# Patient Record
Sex: Male | Born: 1990 | Race: Black or African American | Hispanic: No | Marital: Single | Smoking: Never smoker
Health system: Southern US, Community
[De-identification: ages and names within clinical notes are randomized; demographics above are authoritative.]

## PROBLEM LIST (undated history)

## (undated) DIAGNOSIS — D573 Sickle-cell trait: Secondary | ICD-10-CM

## (undated) HISTORY — PX: TONSILLECTOMY: SUR1361

## (undated) HISTORY — DX: Sickle-cell trait: D57.3

---
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
## 2010-09-20 ENCOUNTER — Inpatient Hospital Stay (INDEPENDENT_AMBULATORY_CARE_PROVIDER_SITE_OTHER)
Admission: RE | Admit: 2010-09-20 | Discharge: 2010-09-20 | Disposition: A | Discharge status: Home / Self Care | Payer: Self-pay | Source: Ambulatory Visit | Attending: Family Medicine | Admitting: Family Medicine

## 2010-09-20 DIAGNOSIS — M62838 Other muscle spasm: Secondary | ICD-10-CM

## 2010-09-20 DIAGNOSIS — T148XXA Other injury of unspecified body region, initial encounter: Secondary | ICD-10-CM

## 2010-09-20 DIAGNOSIS — M549 Dorsalgia, unspecified: Secondary | ICD-10-CM

## 2011-04-03 LAB — INFLUENZA A+B VIRUS AG-DIRECT(RAPID)
Inflenza A Ag: POSITIVE — AB
Influenza B Ag: NEGATIVE

## 2016-09-05 ENCOUNTER — Emergency Department (HOSPITAL_COMMUNITY): Payer: Managed Care, Other (non HMO)

## 2016-09-05 ENCOUNTER — Encounter (HOSPITAL_COMMUNITY): Payer: Self-pay | Admitting: *Deleted

## 2016-09-05 ENCOUNTER — Emergency Department (HOSPITAL_COMMUNITY)
Admission: EM | Admit: 2016-09-05 | Discharge: 2016-09-06 | Disposition: A | Discharge status: Home / Self Care | Payer: Managed Care, Other (non HMO) | Attending: Emergency Medicine | Admitting: Emergency Medicine

## 2016-09-05 DIAGNOSIS — Y999 Unspecified external cause status: Secondary | ICD-10-CM | POA: Insufficient documentation

## 2016-09-05 DIAGNOSIS — W34018A Accidental discharge of other gas, air or spring-operated gun, initial encounter: Secondary | ICD-10-CM | POA: Insufficient documentation

## 2016-09-05 DIAGNOSIS — S91301A Unspecified open wound, right foot, initial encounter: Secondary | ICD-10-CM | POA: Diagnosis present

## 2016-09-05 DIAGNOSIS — Y939 Activity, unspecified: Secondary | ICD-10-CM | POA: Insufficient documentation

## 2016-09-05 DIAGNOSIS — S91331A Puncture wound without foreign body, right foot, initial encounter: Secondary | ICD-10-CM

## 2016-09-05 DIAGNOSIS — F17200 Nicotine dependence, unspecified, uncomplicated: Secondary | ICD-10-CM | POA: Diagnosis not present

## 2016-09-05 DIAGNOSIS — Y929 Unspecified place or not applicable: Secondary | ICD-10-CM | POA: Insufficient documentation

## 2016-09-05 DIAGNOSIS — W3400XA Accidental discharge from unspecified firearms or gun, initial encounter: Secondary | ICD-10-CM

## 2016-09-05 MED ORDER — HYDROMORPHONE HCL 2 MG/ML IJ SOLN
0.5000 mg | Freq: Once | INTRAMUSCULAR | Status: AC
  Administered 2016-09-05: 0.5 mg via INTRAVENOUS
  Filled 2016-09-05: qty 1

## 2016-09-05 MED ORDER — OXYCODONE-ACETAMINOPHEN 5-325 MG PO TABS
1.0000 | ORAL_TABLET | Freq: Once | ORAL | Status: AC
  Administered 2016-09-05: 1 via ORAL
  Filled 2016-09-05: qty 1

## 2016-09-05 MED ORDER — TETANUS-DIPHTH-ACELL PERTUSSIS 5-2.5-18.5 LF-MCG/0.5 IM SUSP
0.5000 mL | Freq: Once | INTRAMUSCULAR | Status: AC
  Administered 2016-09-05: 0.5 mL via INTRAMUSCULAR
  Filled 2016-09-05: qty 0.5

## 2016-09-05 MED ORDER — CEFAZOLIN SODIUM-DEXTROSE 2-4 GM/100ML-% IV SOLN
2.0000 g | Freq: Once | INTRAVENOUS | Status: AC
  Administered 2016-09-05: 2 g via INTRAVENOUS
  Filled 2016-09-05: qty 100

## 2016-09-05 MED ORDER — CEPHALEXIN 500 MG PO CAPS: 500.0000 mg | ORAL_CAPSULE | Freq: Four times a day (QID) | ORAL | 0 refills | Status: DC

## 2016-09-05 MED ORDER — LIDOCAINE HCL (PF) 1 % IJ SOLN
5.0000 mL | Freq: Once | INTRAMUSCULAR | Status: AC
  Administered 2016-09-05: 5 mL
  Filled 2016-09-05: qty 5

## 2016-09-05 MED ORDER — OXYCODONE-ACETAMINOPHEN 5-325 MG PO TABS: 1.0000 | ORAL_TABLET | Freq: Four times a day (QID) | ORAL | 0 refills | Status: DC | PRN
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
## 2016-09-05 NOTE — Discharge Instructions (Signed)
Watch for signs of infection. Follow-up with either orthopedic surgery or podiatry in about a week. You have one stitch in your foot but will need to get removed. Watch for redness or increased swelling of the area. Watch for fevers.

## 2016-09-06 NOTE — Progress Notes (Signed)
Orthopedic Tech Progress Note Patient Details:  Evan Picketerry C Weisgerber Jr. 04/10/1991 [id number]  Ortho Devices Type of Ortho Device: Postop shoe/boot Ortho Device/Splint Location: rle Ortho Device/Splint Interventions: Ordered, Application   Trinna PostMartinez, Micahel Omlor J 09/06/2016, 6:13 AM

## 2016-09-09 ENCOUNTER — Encounter: Payer: Self-pay | Admitting: Podiatry

## 2016-09-09 ENCOUNTER — Ambulatory Visit (INDEPENDENT_AMBULATORY_CARE_PROVIDER_SITE_OTHER): Payer: Managed Care, Other (non HMO) | Admitting: Podiatry

## 2016-09-09 VITALS — BP 110/66 | HR 77 | Temp 99.2°F

## 2016-09-09 DIAGNOSIS — S91301A Unspecified open wound, right foot, initial encounter: Secondary | ICD-10-CM

## 2016-09-09 DIAGNOSIS — S91331A Puncture wound without foreign body, right foot, initial encounter: Principal | ICD-10-CM

## 2016-09-09 DIAGNOSIS — W3400XA Accidental discharge from unspecified firearms or gun, initial encounter: Secondary | ICD-10-CM

## 2016-09-09 MED ORDER — OXYCODONE-ACETAMINOPHEN 5-325 MG PO TABS: 1.0000 | ORAL_TABLET | Freq: Three times a day (TID) | ORAL | 0 refills | Status: DC | PRN

## 2016-09-09 MED ORDER — MUPIROCIN CALCIUM 2 % EX CREA: 1.0000 "application " | TOPICAL_CREAM | Freq: Two times a day (BID) | CUTANEOUS | 0 refills | Status: DC
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
## 2016-09-21 ENCOUNTER — Ambulatory Visit: Payer: Managed Care, Other (non HMO) | Admitting: Podiatry

## 2016-09-23 ENCOUNTER — Encounter: Payer: Self-pay | Admitting: Podiatry

## 2016-09-23 ENCOUNTER — Ambulatory Visit (INDEPENDENT_AMBULATORY_CARE_PROVIDER_SITE_OTHER): Payer: Managed Care, Other (non HMO) | Admitting: Podiatry

## 2016-09-23 DIAGNOSIS — S91301D Unspecified open wound, right foot, subsequent encounter: Secondary | ICD-10-CM | POA: Diagnosis not present

## 2016-09-23 DIAGNOSIS — L97412 Non-pressure chronic ulcer of right heel and midfoot with fat layer exposed: Secondary | ICD-10-CM

## 2016-09-23 DIAGNOSIS — S91331D Puncture wound without foreign body, right foot, subsequent encounter: Principal | ICD-10-CM

## 2016-09-23 DIAGNOSIS — W3400XD Accidental discharge from unspecified firearms or gun, subsequent encounter: Secondary | ICD-10-CM

## 2016-09-28 LAB — WOUND CULTURE
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
## 2016-10-07 ENCOUNTER — Encounter: Payer: Self-pay | Admitting: Podiatry

## 2016-10-07 ENCOUNTER — Ambulatory Visit (INDEPENDENT_AMBULATORY_CARE_PROVIDER_SITE_OTHER): Payer: Managed Care, Other (non HMO) | Admitting: Podiatry

## 2016-10-07 DIAGNOSIS — S91301D Unspecified open wound, right foot, subsequent encounter: Secondary | ICD-10-CM | POA: Diagnosis not present

## 2016-10-07 DIAGNOSIS — W3400XD Accidental discharge from unspecified firearms or gun, subsequent encounter: Secondary | ICD-10-CM | POA: Diagnosis not present

## 2016-10-07 DIAGNOSIS — S91331D Puncture wound without foreign body, right foot, subsequent encounter: Principal | ICD-10-CM
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
## 2016-10-19 ENCOUNTER — Encounter: Payer: Self-pay | Admitting: Podiatry

## 2017-06-01 ENCOUNTER — Ambulatory Visit (INDEPENDENT_AMBULATORY_CARE_PROVIDER_SITE_OTHER): Payer: Managed Care, Other (non HMO) | Admitting: Family Medicine

## 2017-06-01 ENCOUNTER — Encounter: Payer: Self-pay | Admitting: Family Medicine

## 2017-06-01 VITALS — BP 132/80 | HR 80 | Temp 98.1°F | Ht 66.5 in | Wt 147.8 lb

## 2017-06-01 DIAGNOSIS — M545 Low back pain, unspecified: Secondary | ICD-10-CM
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
## 2017-06-01 NOTE — Patient Instructions (Addendum)
Use heat and 2 Aleve twice daily with food. If you decide to get an XR of your low back let me know.

## 2017-06-02 ENCOUNTER — Telehealth: Payer: Self-pay | Admitting: Family Medicine

## 2017-06-02 NOTE — Telephone Encounter (Signed)
Called patient and explained we will not be able to complete his form as he has some back issues that we cannot sign off on.  Patient continued to talk non stop and tell me what Vickie needed to do, on and on.  So I finally inturrupted him and advised we are not doing anything further.  For him to take this day and get another set of forms from his employer and take to whomever his employer recommends.  Patient dismissed.

## 2017-06-02 NOTE — Telephone Encounter (Signed)
Please call let him know that I cannot fill out this form as we discussed yesterday.  We do not have a diagnosis for his low back pain at this point.

## 2017-06-02 NOTE — Telephone Encounter (Signed)
Maria with [employer] called to speak with you.  She wanted to know why you did not complete the FITNESS FOR DUTY FORM.  How will she know what his restrictions are?  This form is given to every employee.  She states this is not workers comp.  [phone]

## 2017-06-02 NOTE — Telephone Encounter (Signed)
Pt dropped off form to be filled out, states it just needs to state no restrictions on there he needs this filled out before he can go back to work, he can be reached at [phone] when ready to be picked up

## 2017-06-02 NOTE — Telephone Encounter (Signed)
The patient is aware and can discuss this with his employer.

## 2017-09-28 ENCOUNTER — Ambulatory Visit (INDEPENDENT_AMBULATORY_CARE_PROVIDER_SITE_OTHER): Payer: Managed Care, Other (non HMO) | Admitting: Family Medicine

## 2017-09-28 ENCOUNTER — Other Ambulatory Visit: Payer: Self-pay

## 2017-09-28 ENCOUNTER — Encounter: Payer: Self-pay | Admitting: Family Medicine

## 2017-09-28 VITALS — BP 110/60 | HR 69 | Temp 98.0°F | Ht 66.0 in | Wt 146.0 lb

## 2017-09-28 DIAGNOSIS — R300 Dysuria: Secondary | ICD-10-CM | POA: Diagnosis not present

## 2017-09-28 DIAGNOSIS — Z113 Encounter for screening for infections with a predominantly sexual mode of transmission: Secondary | ICD-10-CM | POA: Diagnosis not present

## 2017-09-28 DIAGNOSIS — D573 Sickle-cell trait: Secondary | ICD-10-CM

## 2017-09-28 DIAGNOSIS — Z0000 Encounter for general adult medical examination without abnormal findings: Secondary | ICD-10-CM

## 2017-09-28 DIAGNOSIS — Z0001 Encounter for general adult medical examination with abnormal findings: Secondary | ICD-10-CM | POA: Diagnosis not present

## 2017-09-28 LAB — POCT URINALYSIS DIP (MANUAL ENTRY)
Bilirubin, UA: NEGATIVE
Glucose, UA: NEGATIVE mg/dL
Ketones, POC UA: NEGATIVE mg/dL
Leukocytes, UA: NEGATIVE
Nitrite, UA: NEGATIVE
PROTEIN UA: NEGATIVE mg/dL
RBC UA: NEGATIVE
SPEC GRAV UA: 1.02 (ref 1.010–1.025)
UROBILINOGEN UA: 0.2 U/dL
pH, UA: 7 (ref 5.0–8.0)

## 2017-09-28 LAB — POCT GLYCOSYLATED HEMOGLOBIN (HGB A1C): HEMOGLOBIN A1C: 5.6

## 2017-09-28 NOTE — Patient Instructions (Addendum)
It was great meeting you today! I am glad that things have been going well. In regards to your burning with urination, that good news is that your urine sample came back with no problems. Unfortunately we will have to have you back another day for the std screening as this test needs to be done 1 hour after your urine. Unfortunately the lab will be closed at this point. I will draw some blood work today. The labs I will get will be a cbc, cmp, lipid panel, a1c, and an hiv screen. In order these labs will test your blood levels, kidney/liver function, cholesterol, if you have diabetes, and for hiv.

## 2017-09-29 ENCOUNTER — Encounter: Payer: Self-pay | Admitting: Family Medicine

## 2017-09-29 DIAGNOSIS — Z0000 Encounter for general adult medical examination without abnormal findings: Secondary | ICD-10-CM | POA: Insufficient documentation

## 2017-09-29 DIAGNOSIS — Z113 Encounter for screening for infections with a predominantly sexual mode of transmission: Secondary | ICD-10-CM | POA: Insufficient documentation

## 2017-09-29 DIAGNOSIS — D573 Sickle-cell trait: Secondary | ICD-10-CM | POA: Insufficient documentation

## 2017-09-29 DIAGNOSIS — R300 Dysuria: Secondary | ICD-10-CM | POA: Insufficient documentation

## 2017-09-29 LAB — CBC WITH DIFFERENTIAL/PLATELET
BASOS: 0 %
Basophils Absolute: 0 10*3/uL (ref 0.0–0.2)
EOS (ABSOLUTE): 0.2 10*3/uL (ref 0.0–0.4)
EOS: 3 %
HEMATOCRIT: 42.1 % (ref 37.5–51.0)
HEMOGLOBIN: 15.4 g/dL (ref 13.0–17.7)
IMMATURE GRANS (ABS): 0 10*3/uL (ref 0.0–0.1)
IMMATURE GRANULOCYTES: 0 %
LYMPHS: 41 %
Lymphocytes Absolute: 2.2 10*3/uL (ref 0.7–3.1)
MCH: 30.3 pg (ref 26.6–33.0)
MCHC: 36.6 g/dL — ABNORMAL HIGH (ref 31.5–35.7)
MCV: 83 fL (ref 79–97)
MONOCYTES: 11 %
Monocytes Absolute: 0.6 10*3/uL (ref 0.1–0.9)
NEUTROS ABS: 2.4 10*3/uL (ref 1.4–7.0)
NEUTROS PCT: 45 %
Platelets: 266 10*3/uL (ref 150–379)
RBC: 5.09 x10E6/uL (ref 4.14–5.80)
RDW: 13.6 % (ref 12.3–15.4)
WBC: 5.5 10*3/uL (ref 3.4–10.8)

## 2017-09-29 LAB — COMPREHENSIVE METABOLIC PANEL
ALBUMIN: 4.8 g/dL (ref 3.5–5.5)
ALT: 12 IU/L (ref 0–44)
AST: 11 IU/L (ref 0–40)
Albumin/Globulin Ratio: 2.2 (ref 1.2–2.2)
Alkaline Phosphatase: 62 IU/L (ref 39–117)
BUN/Creatinine Ratio: 14 (ref 9–20)
BUN: 11 mg/dL (ref 6–20)
Bilirubin Total: 0.2 mg/dL (ref 0.0–1.2)
CALCIUM: 9.3 mg/dL (ref 8.7–10.2)
CO2: 23 mmol/L (ref 20–29)
CREATININE: 0.79 mg/dL (ref 0.76–1.27)
Chloride: 101 mmol/L (ref 96–106)
GFR, EST AFRICAN AMERICAN: 140 mL/min/{1.73_m2} (ref >59)
GFR, EST NON AFRICAN AMERICAN: 121 mL/min/{1.73_m2} (ref >59)
GLOBULIN, TOTAL: 2.2 g/dL (ref 1.5–4.5)
Glucose: 84 mg/dL (ref 65–99)
Potassium: 4.1 mmol/L (ref 3.5–5.2)
SODIUM: 138 mmol/L (ref 134–144)
TOTAL PROTEIN: 7 g/dL (ref 6.0–8.5)

## 2017-09-29 LAB — LIPID PANEL
CHOL/HDL RATIO: 2.1 ratio (ref 0.0–5.0)
Cholesterol, Total: 185 mg/dL (ref 100–199)
HDL: 90 mg/dL (ref >39)
LDL CALC: 88 mg/dL (ref 0–99)
TRIGLYCERIDES: 36 mg/dL (ref 0–149)
VLDL Cholesterol Cal: 7 mg/dL (ref 5–40)

## 2017-09-29 LAB — HIV ANTIBODY (ROUTINE TESTING W REFLEX): HIV SCREEN 4TH GENERATION: NONREACTIVE

## 2017-09-29 NOTE — Assessment & Plan Note (Signed)
UA negative. Unclear what is causing his occasional burning with urination. Will ask patient to come back for std screen as could not collect second urine sample before lab closed. Most likely explanation is dehydration.
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
## 2017-09-29 NOTE — Assessment & Plan Note (Signed)
Educated patient on sickle cell trait. Told him to be careful about strenuous activity at high altitudes as this is his main risk factor.

## 2017-09-29 NOTE — Assessment & Plan Note (Signed)
HIV negative. CBC, CMP, lipid panel, ua, all negative for abnormality. Up to date on all health maintenance items.

## 2017-10-01 ENCOUNTER — Telehealth: Payer: Self-pay | Admitting: Family Medicine

## 2017-10-01 NOTE — Telephone Encounter (Signed)
Called patient to inform him that cbc, cmp, hiv, a1c were all within normal limits or negative. Patient glad to hear news and glad that I called him.  Myrene BuddyJacob Hattie Pine MD PGY-1 Family Medicine Resident

## 2019-02-16 ENCOUNTER — Other Ambulatory Visit: Payer: Self-pay

## 2019-02-16 ENCOUNTER — Emergency Department (HOSPITAL_COMMUNITY)
Admission: EM | Admit: 2019-02-16 | Discharge: 2019-02-17 | Disposition: A | Discharge status: Home / Self Care | Payer: Managed Care, Other (non HMO)

## 2019-02-17 NOTE — ED Notes (Signed)
Pt called for triage, no response from the lobby. 

## 2020-04-02 ENCOUNTER — Emergency Department (HOSPITAL_COMMUNITY)
Admission: EM | Admit: 2020-04-02 | Discharge: 2020-04-02 | Disposition: A | Discharge status: Home / Self Care | Payer: Self-pay | Attending: Emergency Medicine | Admitting: Emergency Medicine

## 2020-04-02 ENCOUNTER — Emergency Department (HOSPITAL_COMMUNITY): Payer: Self-pay

## 2020-04-02 ENCOUNTER — Encounter (HOSPITAL_COMMUNITY): Payer: Self-pay | Admitting: *Deleted

## 2020-04-02 DIAGNOSIS — Z5321 Procedure and treatment not carried out due to patient leaving prior to being seen by health care provider: Secondary | ICD-10-CM | POA: Insufficient documentation

## 2020-04-02 DIAGNOSIS — W230XXA Caught, crushed, jammed, or pinched between moving objects, initial encounter: Secondary | ICD-10-CM | POA: Insufficient documentation

## 2020-04-02 DIAGNOSIS — S6991XA Unspecified injury of right wrist, hand and finger(s), initial encounter: Secondary | ICD-10-CM | POA: Insufficient documentation

## 2020-04-02 DIAGNOSIS — Y92810 Car as the place of occurrence of the external cause: Secondary | ICD-10-CM | POA: Insufficient documentation

## 2020-04-02 NOTE — ED Notes (Signed)
No answer x3 for ready room 
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
## 2020-04-03 ENCOUNTER — Ambulatory Visit
Admission: EM | Admit: 2020-04-03 | Discharge: 2020-04-03 | Disposition: A | Discharge status: Home / Self Care | Payer: Self-pay | Attending: Family Medicine | Admitting: Family Medicine

## 2020-04-03 ENCOUNTER — Ambulatory Visit
Admission: EM | Admit: 2020-04-03 | Discharge: 2020-04-03 | Disposition: A | Discharge status: Other Acute Inpatient Hospital | Payer: Self-pay

## 2020-04-03 ENCOUNTER — Other Ambulatory Visit: Payer: Self-pay

## 2020-04-03 DIAGNOSIS — M20011 Mallet finger of right finger(s): Secondary | ICD-10-CM
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
## 2020-04-03 NOTE — ED Provider Notes (Signed)
[facility]    CSN: [id number] Arrival date & time: 04/03/20  1633      History   Chief Complaint Chief Complaint  Patient presents with  . Finger Injury    HPI Evan Espin. is a 29 y.o. male.   HPI  Patient woke up Monday with his right middle finger and noticed his finger was curved down. Reports pain with bending his hand and making a fist. On Sunday was renovating his mom's house. Denies injuring finger. Took some ibuprofen with some relief. He brought a finger splin which has helped his pain. States his boss at [employer] told him he needed to be seen as he was wearing a splint on his finger.    Past Medical History:  Diagnosis Date  . Sickle cell trait [employer])     Patient Active Problem List   Diagnosis Date Noted  . Healthcare maintenance 09/29/2017  . Dysuria 09/29/2017  . Screening for STD (sexually transmitted disease) 09/29/2017  . Sickle cell trait (HCC) 09/29/2017    Past Surgical History:  Procedure Laterality Date  . TONSILLECTOMY         Home Medications    Prior to Admission medications   Not on File    Family History History reviewed. No pertinent family history.  Social History Social History   Tobacco Use  . Smoking status: Never Smoker  . Smokeless tobacco: Current User  Substance Use Topics  . Alcohol use: No  . Drug use: Yes    Types: Marijuana     Allergies   Patient has no known allergies.   Review of Systems Review of Systems: See HPI    Physical Exam Triage Vital Signs ED Triage Vitals  Enc Vitals Group     BP 04/03/20 1650 124/86     Pulse Rate 04/03/20 1650 71     Resp 04/03/20 1650 18     Temp 04/03/20 1650 98.1 F (36.7 C)     Temp Source 04/03/20 1650 Oral     SpO2 04/03/20 1650 100 %     Weight 04/03/20 1652 155 lb (70.3 kg)     Height 04/03/20 1652 5\' 7"  (1.702 m)     Head Circumference --      Peak Flow --      Pain Score 04/03/20 1651 0     Pain Loc --      Pain Edu? --       Excl. in GC? --    No data found.  Updated Vital Signs BP 124/86   Pulse 71   Temp 98.1 F (36.7 C) (Oral)   Resp 18   Ht 5\' 7"  (1.702 m)   Wt 155 lb (70.3 kg)   SpO2 100%   BMI 24.28 kg/m   Visual Acuity Right Eye Distance:   Left Eye Distance:   Bilateral Distance:    Right Eye Near:   Left Eye Near:    Bilateral Near:     Physical Exam Vitals and nursing note reviewed.  Constitutional:      General: He is not in acute distress.    Appearance: Normal appearance.  HENT:     Head: Normocephalic and atraumatic.     Nose: Nose normal.  Eyes:     Extraocular Movements: Extraocular movements intact.     Conjunctiva/sclera: Conjunctivae normal.  Cardiovascular:     Rate and Rhythm: Normal rate.     Pulses: Normal pulses.  Pulmonary:  Effort: Pulmonary effort is normal. No respiratory distress.  Musculoskeletal:        General: Swelling (mild) and tenderness (right middle finger) present.     Cervical back: Normal range of motion.     Comments: Right DIP of middle finger stuck in flexion, no extension c/w mallet finger   Skin:    General: Skin is warm.     Capillary Refill: Capillary refill takes less than 2 seconds.  Neurological:     Mental Status: He is alert and oriented to person, place, and time.      UC Treatments / Results  Labs (all labs ordered are listed, but only abnormal results are displayed) Labs Reviewed - No data to display  EKG   Radiology DG Finger Middle Right  Result Date: 04/02/2020 CLINICAL DATA:  Pain EXAM: RIGHT MIDDLE FINGER 2+V COMPARISON:  None. FINDINGS: There is no evidence of fracture or dislocation. There is no evidence of arthropathy or other focal bone abnormality. Soft tissues are unremarkable. IMPRESSION: Negative. Electronically Signed   By: Katherine Mantle M.D.   On: 04/02/2020 20:45    Procedures Procedures (including critical care time)  Medications Ordered in UC Medications - No data to display  Initial  Impression / Assessment and Plan / UC Course  I have reviewed the triage vital signs and the nursing notes.  Pertinent labs & imaging results that were available during my care of the patient were reviewed by me and considered in my medical decision making (see chart for details).     Mallet Finger  Reviewed xrays from ED visit yesterday though pt left without being seen. Patient denies trauma. Xrays normal. Exam consistent with mallet finger. Patient to follow up with [facility] for hand surgery. Stacker splint applied. Advised patient to wear splint at all times. OTC medication for pain as needed. Work note provided. Patient agrees with plan.    Final Clinical Impressions(s) / UC Diagnoses   Final diagnoses:  Mallet finger of right hand     Discharge Instructions     You were seen at the Urgent Care for finger injury. Please pick up your prescriptions at your pharmacy. Be sure to wear splint all day to prevent re-injury for the next 8 weeks. Follow up with hand surgery at [facility].  If you haven't already, sign up for My Chart to have easy access to your labs results, and communication with your primary care physician.  Dr. Rachael Darby      ED Prescriptions    None     PDMP not reviewed this encounter.   Katha Cabal, DO 04/03/20 1823

## 2021-09-14 IMAGING — DX DG FINGER MIDDLE 2+V*R*
3 series · 3 of 3 positions shown · non-contrast
Comparison: None.

CLINICAL DATA: Pain

EXAM:
RIGHT MIDDLE FINGER 2+V

[finger ap]
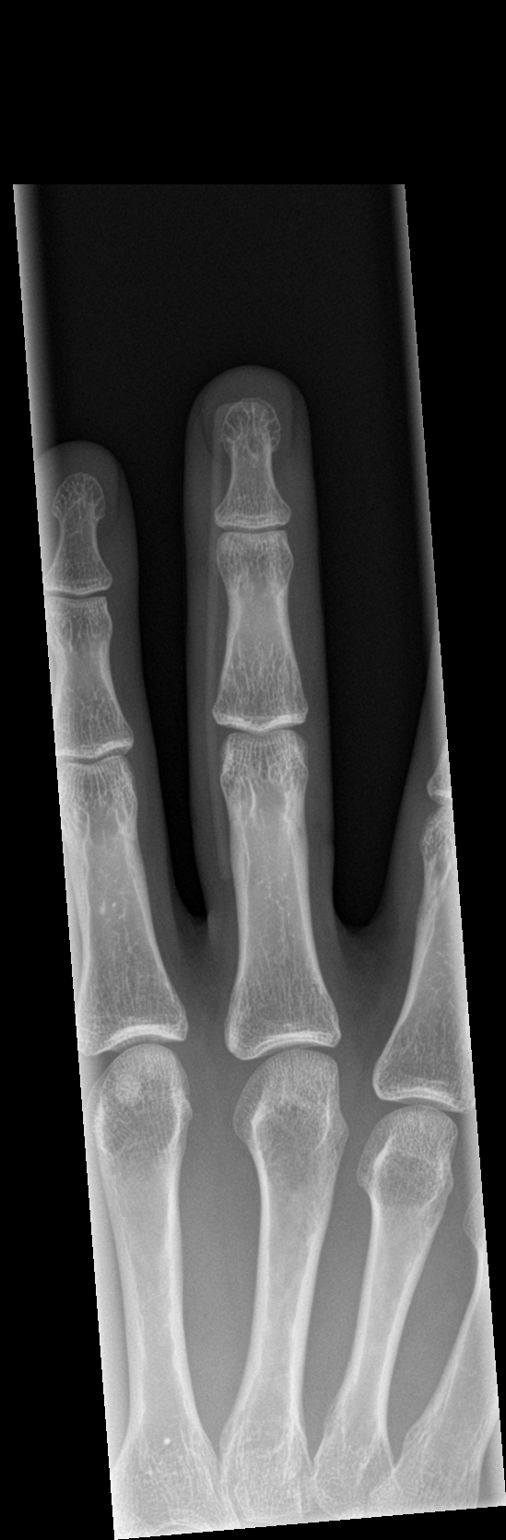

[finger obl]
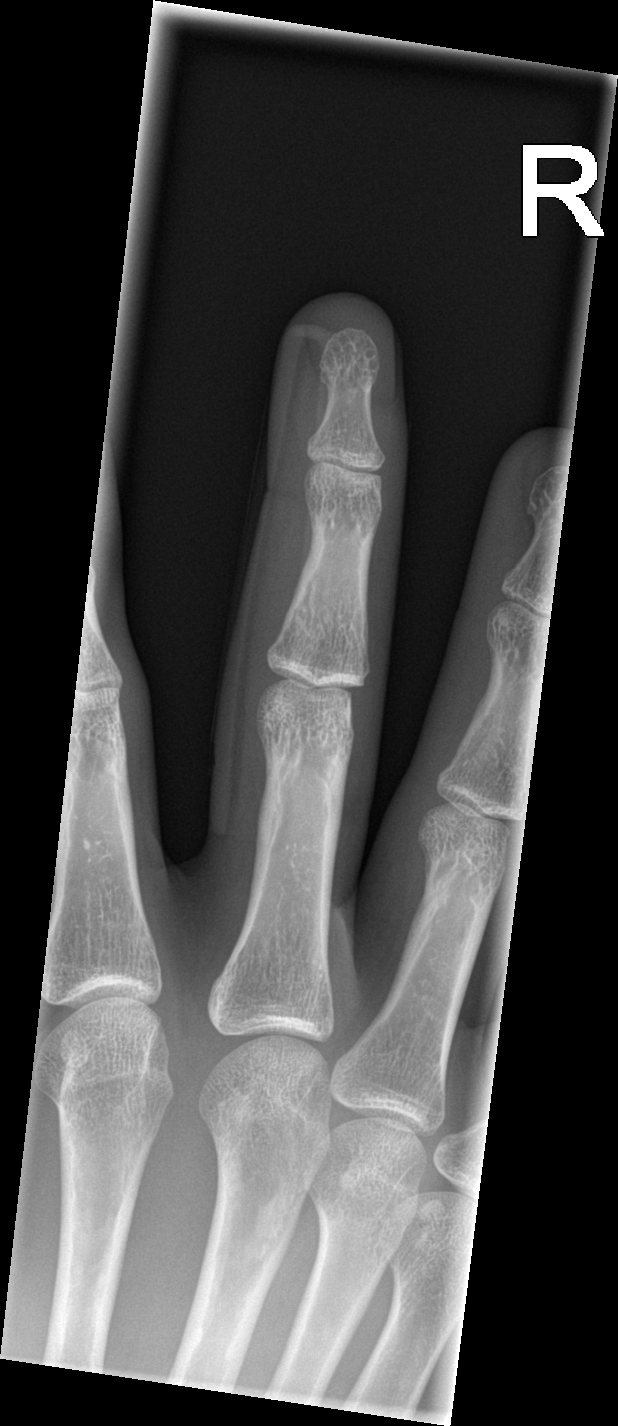

[finger lat]
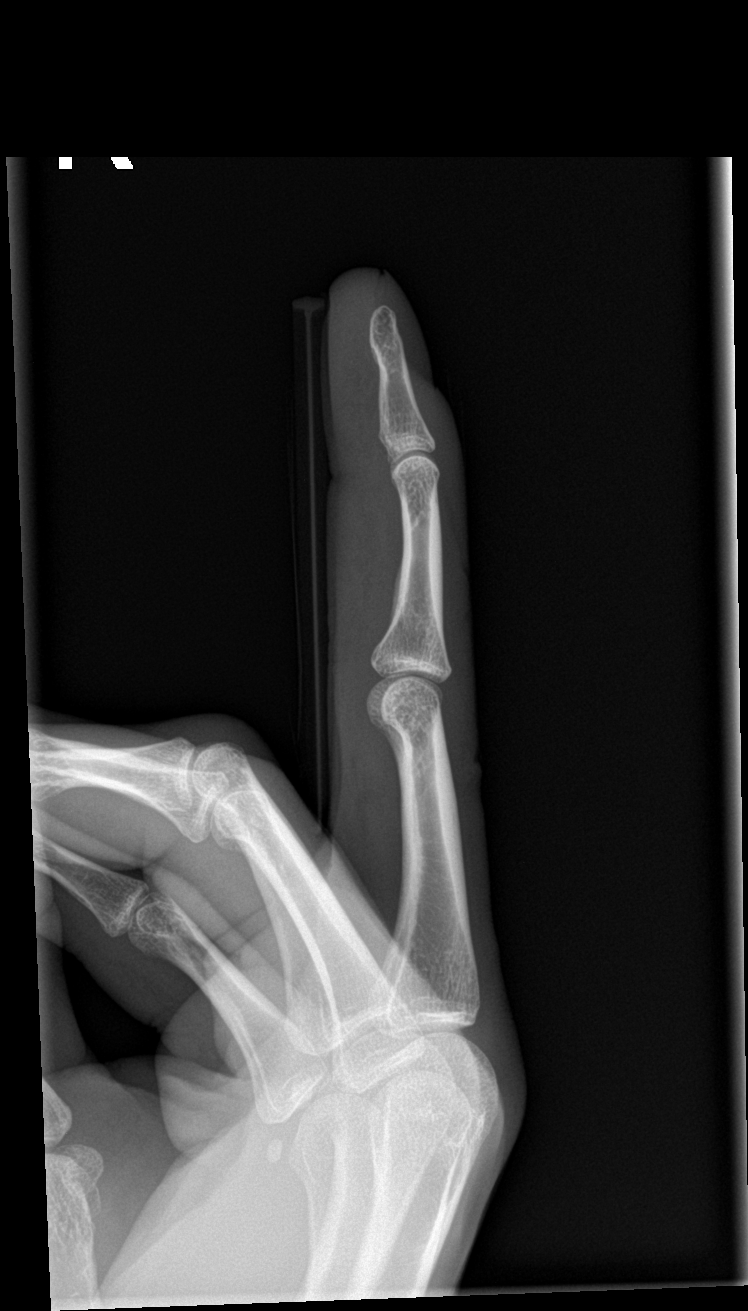

[3 of 3 positions shown; findings below may reference images not displayed]

FINDINGS: There is no evidence of fracture or dislocation. There is no
evidence of arthropathy or other focal bone abnormality. Soft
tissues are unremarkable.
IMPRESSION: Negative.
